# Patient Record
Sex: Female | Born: 1965 | Race: Black or African American | Hispanic: No | Marital: Married | Smoking: Never smoker
Health system: Southern US, Community
[De-identification: ages and names within clinical notes are randomized; demographics above are authoritative.]

## PROBLEM LIST (undated history)

## (undated) DIAGNOSIS — T7840XA Allergy, unspecified, initial encounter: Secondary | ICD-10-CM

## (undated) DIAGNOSIS — E119 Type 2 diabetes mellitus without complications: Secondary | ICD-10-CM

## (undated) DIAGNOSIS — D573 Sickle-cell trait: Secondary | ICD-10-CM

## (undated) DIAGNOSIS — R42 Dizziness and giddiness: Secondary | ICD-10-CM

## (undated) HISTORY — DX: Dizziness and giddiness: R42

## (undated) HISTORY — DX: Allergy, unspecified, initial encounter: T78.40XA

## (undated) HISTORY — DX: Sickle-cell trait: D57.3

---

## 1998-10-21 ENCOUNTER — Other Ambulatory Visit: Admission: RE | Admit: 1998-10-21 | Discharge: 1998-10-21 | Payer: Self-pay | Admitting: Family Medicine

## 2006-06-10 ENCOUNTER — Ambulatory Visit: Payer: Self-pay | Admitting: Family Medicine

## 2006-07-19 ENCOUNTER — Ambulatory Visit: Payer: Self-pay | Admitting: Family Medicine

## 2006-07-19 ENCOUNTER — Encounter (INDEPENDENT_AMBULATORY_CARE_PROVIDER_SITE_OTHER): Payer: Self-pay | Admitting: Family Medicine

## 2007-07-13 ENCOUNTER — Emergency Department (HOSPITAL_COMMUNITY): Admission: EM | Admit: 2007-07-13 | Discharge: 2007-07-13 | Payer: Self-pay | Admitting: Emergency Medicine

## 2008-05-03 ENCOUNTER — Emergency Department (HOSPITAL_COMMUNITY): Admission: EM | Admit: 2008-05-03 | Discharge: 2008-05-03 | Payer: Self-pay | Admitting: Emergency Medicine

## 2010-09-16 ENCOUNTER — Other Ambulatory Visit (HOSPITAL_COMMUNITY)
Admission: RE | Admit: 2010-09-16 | Discharge: 2010-09-16 | Disposition: A | Discharge status: Home / Self Care | Payer: BC Managed Care – PPO | Source: Ambulatory Visit | Attending: Obstetrics and Gynecology | Admitting: Obstetrics and Gynecology

## 2010-09-16 ENCOUNTER — Other Ambulatory Visit: Payer: Self-pay | Admitting: Obstetrics and Gynecology

## 2010-09-16 DIAGNOSIS — Z01419 Encounter for gynecological examination (general) (routine) without abnormal findings: Secondary | ICD-10-CM | POA: Insufficient documentation

## 2010-09-16 DIAGNOSIS — Z113 Encounter for screening for infections with a predominantly sexual mode of transmission: Secondary | ICD-10-CM | POA: Insufficient documentation

## 2010-09-16 DIAGNOSIS — Z1159 Encounter for screening for other viral diseases: Secondary | ICD-10-CM | POA: Insufficient documentation

## 2011-03-12 LAB — POCT RAPID STREP A: Streptococcus, Group A Screen (Direct): NEGATIVE

## 2011-10-26 ENCOUNTER — Other Ambulatory Visit (HOSPITAL_COMMUNITY)
Admission: RE | Admit: 2011-10-26 | Discharge: 2011-10-26 | Disposition: A | Discharge status: Home / Self Care | Payer: BC Managed Care – PPO | Source: Ambulatory Visit | Attending: Obstetrics and Gynecology | Admitting: Obstetrics and Gynecology

## 2011-10-26 ENCOUNTER — Other Ambulatory Visit: Payer: Self-pay | Admitting: Obstetrics and Gynecology

## 2011-10-26 DIAGNOSIS — Z01419 Encounter for gynecological examination (general) (routine) without abnormal findings: Secondary | ICD-10-CM | POA: Insufficient documentation

## 2013-06-04 ENCOUNTER — Encounter (HOSPITAL_COMMUNITY): Payer: Self-pay | Admitting: Emergency Medicine

## 2013-06-04 ENCOUNTER — Emergency Department (INDEPENDENT_AMBULATORY_CARE_PROVIDER_SITE_OTHER)
Admission: EM | Admit: 2013-06-04 | Discharge: 2013-06-04 | Disposition: A | Discharge status: Home / Self Care | Payer: BC Managed Care – PPO | Source: Home / Self Care | Attending: Family Medicine | Admitting: Family Medicine

## 2013-06-04 DIAGNOSIS — H8110 Benign paroxysmal vertigo, unspecified ear: Secondary | ICD-10-CM

## 2013-06-04 MED ORDER — MECLIZINE HCL 25 MG PO TABS: 25.0000 mg | ORAL_TABLET | Freq: Three times a day (TID) | ORAL | Status: DC | PRN

## 2013-06-04 NOTE — ED Notes (Signed)
Reportedly dizzy since last PM when she tried to read something; worse when she changes direction

## 2013-06-04 NOTE — ED Notes (Signed)
Encouraged no driving while dizzy or on her Rx ; advised to f/u w ENT if no better

## 2013-06-04 NOTE — ED Provider Notes (Signed)
Kelly Shelton is a 47 y.o. female who presents to Urgent Care today for dizziness. This is been present since yesterday. Patient notes an unsteady still spitting room sensation especially with head motion. She denies any weakness numbness loss of function trouble swallowing or speaking. She had a headache yesterday but none today. She denies any significant blurry vision. No nausea vomiting or diarrhea. No injury.   History reviewed. No pertinent past medical history. History  Substance Use Topics  . Smoking status: Never Smoker   . Smokeless tobacco: Not on file  . Alcohol Use: No   ROS as above Medications reviewed. No current facility-administered medications for this encounter.   Current Outpatient Prescriptions  Medication Sig Dispense Refill  . meclizine (ANTIVERT) 25 MG tablet Take 1 tablet (25 mg total) by mouth 3 (three) times daily as needed for dizziness.  28 tablet  0    Exam:  BP 149/97  Pulse 65  Temp(Src) 97.5 F (36.4 C) (Oral)  Resp 18  SpO2 98% Gen: Well NAD HEENT: EOMI,  MMM, tympanic membranes are normal appearing bilaterally Lungs: Normal work of breathing. CTABL Heart: RRR no MRG Exts: Non edematous BL  LE, warm and well perfused.  Neuro: Alert and oriented cranial nerves II through XII are intact normal reflexes strength and sensation. Normal coordination gait. 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  Plan to treat with meclizine. If not improved followup with ENT.  Discussed warning signs or symptoms. Please see discharge instructions. Patient expresses understanding.      Rodolph Bong, MD 06/04/13 [phone]

## 2013-10-24 ENCOUNTER — Encounter: Payer: Self-pay | Admitting: Obstetrics & Gynecology

## 2013-10-24 ENCOUNTER — Ambulatory Visit (INDEPENDENT_AMBULATORY_CARE_PROVIDER_SITE_OTHER): Payer: BC Managed Care – PPO | Admitting: Obstetrics & Gynecology

## 2013-10-24 VITALS — Temp 97.7°F | Ht 59.0 in | Wt 270.0 lb

## 2013-10-24 DIAGNOSIS — N951 Menopausal and female climacteric states: Secondary | ICD-10-CM

## 2013-10-24 DIAGNOSIS — E2839 Other primary ovarian failure: Secondary | ICD-10-CM

## 2013-10-24 DIAGNOSIS — Z780 Asymptomatic menopausal state: Secondary | ICD-10-CM

## 2013-10-24 DIAGNOSIS — E288 Other ovarian dysfunction: Secondary | ICD-10-CM

## 2013-10-24 NOTE — Progress Notes (Signed)
See other note from today

## 2013-10-25 DIAGNOSIS — E2839 Other primary ovarian failure: Secondary | ICD-10-CM | POA: Insufficient documentation

## 2013-10-25 DIAGNOSIS — E288 Other ovarian dysfunction: Principal | ICD-10-CM

## 2013-10-25 LAB — GC/CHLAMYDIA PROBE AMP
CT Probe RNA: NEGATIVE
GC Probe RNA: NEGATIVE

## 2013-10-25 LAB — FOLLICLE STIMULATING HORMONE: FSH: 34.4 m[IU]/mL

## 2013-10-25 LAB — ESTRADIOL: Estradiol: 16.5 pg/mL

## 2013-10-25 NOTE — Progress Notes (Signed)
Patient ID: Kelly Shelton, female   DOB: 05/17/1966, 48 y.o.   MRN: [id number]  Chief Complaint  Patient presents with  . Advice Only    Infertility    HPI Kelly Shelton is a 48 y.o. female.  Diagnosed by a local gynecologist with early menopause several years ago.  A baseline BMD study was normal per the pt.  HRT was recommended.  The patient was noncompliant with HRT.  She recently married and wants to explore fertility options. HPI  Past Medical History  Diagnosis Date  . Vertigo     Past Surgical History  Procedure Laterality Date  . Cesarean section      Family History  Problem Relation Age of Onset  . Adopted: Yes    Social History History  Substance Use Topics  . Smoking status: Never Smoker   . Smokeless tobacco: Not on file  . Alcohol Use: No    No Known Allergies  No current outpatient prescriptions on file.   No current facility-administered medications for this visit.    Review of Systems Review of Systems Constitutional: negative for fatigue and weight loss Respiratory: negative for cough and wheezing Cardiovascular: negative for chest pain, fatigue and palpitations Gastrointestinal: negative for abdominal pain and change in bowel habits Genitourinary:negative Integument/breast: negative for nipple discharge Musculoskeletal:negative for myalgias Neurological: negative for gait problems and tremors Behavioral/Psych: negative for abusive relationship, depression Endocrine: negative for temperature intolerance     Temperature 97.7 F (36.5 C), height 4\' 11"  (1.499 m), weight 122.471 kg (270 lb).  Physical Exam Physical Exam General:   morbidly obese  Skin:   no rash or abnormalities  Lungs:   clear to auscultation bilaterally  Heart:   regular rate and rhythm, S1, S2 normal, no murmur, click, rub or gallop  Breasts:   normal without suspicious masses, skin or nipple changes or axillary nodes  Abdomen:  normal findings: no organomegaly, soft,  non-tender and no hernia  Pelvis:  External genitalia: normal general appearance Urinary system: urethral meatus normal and bladder without fullness, nontender Vaginal: normal without tenderness, induration or masses Cervix: normal appearance Adnexa: limited by body habitus Uterus: limited by body habitus   Data Reviewed None  Assessment    Likely early menopause/ovarian failure;  DDX--> 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         Antionette CharLisa Jackson-Moore 10/25/2013, 4:05 AM

## 2013-10-25 NOTE — Patient Instructions (Signed)
Menopause Menopause is the normal time of life when menstrual periods stop completely. Menopause is complete when you have missed 12 consecutive menstrual periods. It usually occurs between the ages of 46 years and 71 years. Very rarely does a woman develop menopause before the age of 46 years. At menopause, your ovaries stop producing the female hormones estrogen and progesterone. This can cause undesirable symptoms and also affect your health. Sometimes the symptoms may occur 4 5 years before the menopause begins. There is no relationship between menopause and:  Oral contraceptives.  Number of children you had.  Race.  The age your menstrual periods started (menarche). Heavy smokers and very thin women may develop menopause earlier in life. CAUSES  The ovaries stop producing the female hormones estrogen and progesterone.  Other causes include:  Surgery to remove both ovaries.  The ovaries stop functioning for no known reason.  Tumors of the pituitary gland in the brain.  Medical disease that affects the ovaries and hormone production.  Radiation treatment to the abdomen or pelvis.  Chemotherapy that affects the ovaries. SYMPTOMS   Hot flashes.  Night sweats.  Decrease in sex drive.  Vaginal dryness and thinning of the vagina causing painful intercourse.  Dryness of the skin and developing wrinkles.  Headaches.  Tiredness.  Irritability.  Memory problems.  Weight gain.  Bladder infections.  Hair growth of the face and chest.  Infertility. More serious symptoms include:  Loss of bone (osteoporosis) causing breaks (fractures).  Depression.  Hardening and narrowing of the arteries (atherosclerosis) causing heart attacks and strokes. DIAGNOSIS   When the menstrual periods have stopped for 12 straight months.  Physical exam.  Hormone studies of the blood. TREATMENT  There are many treatment choices and nearly as many questions about them. The  decisions to treat or not to treat menopausal changes is an individual choice made with your health care provider. Your health care provider can discuss the treatments with you. Together, you can decide which treatment will work best for you. Your treatment choices may include:   Hormone therapy (estrogen and progesterone).  Non-hormonal medicines.  Treating the individual symptoms with medicine (for example antidepressants for depression).  Herbal medicines that may help specific symptoms.  Counseling by a psychiatrist or psychologist.  Group therapy.  Lifestyle changes including:  Eating healthy.  Regular exercise.  Limiting caffeine and alcohol.  Stress management and meditation.  No treatment. HOME CARE INSTRUCTIONS   Take the medicine your health care provider gives you as directed.  Get plenty of sleep and rest.  Exercise regularly.  Eat a diet that contains calcium (good for the bones) and soy products (acts like estrogen hormone).  Avoid alcoholic beverages.  Do not smoke.  If you have hot flashes, dress in layers.  Take supplements, calcium, and vitamin D to strengthen bones.  You can use over-the-counter lubricants or moisturizers for vaginal dryness.  Group therapy is sometimes very helpful.  Acupuncture may be helpful in some cases. SEEK MEDICAL CARE IF:   You are not sure you are in menopause.  You are having menopausal symptoms and need advice and treatment.  You are still having menstrual periods after age 46 years.  You have pain with intercourse.  Menopause is complete (no menstrual period for 12 months) and you develop vaginal bleeding.  You need a referral to a specialist (gynecologist, psychiatrist, or psychologist) for treatment. SEEK IMMEDIATE MEDICAL CARE IF:   You have severe depression.  You have excessive vaginal bleeding.  You fell and think you have a broken bone.  You have pain when you urinate.  You develop leg or  chest pain.  You have a fast pounding heart beat (palpitations).  You have severe headaches.  You develop vision problems.  You feel a lump in your breast.  You have abdominal pain or severe indigestion. Document Released: 08/28/2003 Document Revised: 02/07/2013 Document Reviewed: 01/04/2013 [facility] Patient Information 2014 [place], [place]. Infertility WHAT IS INFERTILITY?  Infertility is usually defined as not being able to get pregnant after trying for one year of regular sexual intercourse without the use of contraceptives. Or not being able to carry a pregnancy to term and have a baby. The infertility rate in the [place] is around 10%. Pregnancy is the result of a chain of events. A woman must release an egg from one of her ovaries (ovulation). The egg must be fertilized by the female sperm. Then it travels through a fallopian tube into the uterus (womb), where it attaches to the wall of the uterus and grows. A man must have enough sperm, and the sperm must join with (fertilize) the egg along the way, at the proper time. The fertilized egg must then become attached to the inside of the uterus. While this may seem simple, many things can happen to prevent pregnancy from occurring.  WHOSE PROBLEM IS IT?  About 20% of infertility cases are due to problems with the man (female factors) and 65% are due to problems with the woman (female factors). Other cases are due to a combination of female and female factors or to unknown causes.  WHAT CAUSES INFERTILITY IN MEN?  Infertility in men is often caused by problems with making enough normal sperm or getting the sperm to reach the egg. Problems with sperm may exist from birth or develop later in life, due to illness or injury. Some men produce no sperm, or produce too few sperm (oligospermia). Other problems include:  Sexual dysfunction.  Hormonal or endocrine problems.  Age. Female fertility decreases with age, but not at as young an age as  female fertility.  Infection.  Congenital problems. Birth defect, such as absence of the tubes that carry the sperm (vas deferens).  Genetic/chromosomal problems.  Antisperm antibody problems.  Retrograde ejaculation (sperm go into the bladder).  Varicoceles, spematoceles, or tumors of the testicles.  Lifestyle can influence the number and quality of a man's sperm.  Alcohol and drugs can temporarily reduce sperm quality.  Environmental toxins, including pesticides and lead, may cause some cases of infertility in men. WHAT CAUSES INFERTILITY IN WOMEN?   Problems with ovulation account for most infertility in women. Without ovulation, eggs are not available to be fertilized.  Signs of problems with ovulation include irregular menstrual periods or no periods at all.  Simple lifestyle factors, including stress, diet, or athletic training, can affect a woman's hormonal balance.  Age. Fertility begins to decrease in women in the early 60s and is worse after age 31.  Much less often, a hormonal imbalance from a serious medical problem, such as a pituitary gland tumor, thyroid or other chronic medical disease, can cause ovulation problems.  Pelvic infections.  Polycystic ovary syndrome (increase in female hormones, unable to ovulate).  Alcohol or illegal drugs.  Environmental toxins, radiation, pesticides, and certain chemicals.  Aging is an important factor in female infertility.  The ability of a woman's ovaries to produce eggs declines with age, especially after age 57. About one third of couples where  the woman is over 79 will have problems with fertility.  By the time she reaches menopause when her monthly periods stop for good, a woman can no longer produce eggs or become pregnant.  Other problems can also lead to infertility in women. If the fallopian tubes are blocked at one or both ends, the egg cannot travel through the tubes into the uterus. Scar tissue (adhesions) in  the pelvis may cause blocked tubes. This may result from pelvic inflammatory disease, endometriosis, or surgery for an ectopic pregnancy (fertilized egg implanted outside the uterus) or any pelvic or abdominal surgery causing adhesions.  Fibroid tumors or polyps of the uterus.  Congenital (birth defect) abnormalities of the uterus.  Infection of the cervix (cervicitis).  Cervical stenosis (narrowing).  Abnormal cervical mucus.  Polycystic ovary syndrome.  Having sexual intercourse too often (every other day or 4 to 5 times a week).  Obesity.  Anorexia.  Poor nutrition.  Over exercising, with loss of body fat.  DES. Your mother received diethylstilbesterol hormone when pregnant with you. HOW IS INFERTILITY TESTED?  If you have been trying to have a baby without success, you may want to seek medical help. You should not wait for one year of trying before seeing a health care provider if:  You are over 35.  You have reason to believe that there may be a fertility problem. A medical evaluation may determine the reasons for a couple's infertility. Usually this process begins with:  Physical exams.  Medical histories of both partners.  Sexual histories of both partners. If there is no obvious problem, like improperly timed intercourse or absence of ovulation, tests may be needed.   For a man, testing usually begins with tests of his semen to look at:  The number of sperm.  The shape of sperm.  Movement of his sperm.  Taking a complete medical and surgical history.  Physical examination.  Check for infection of the female reproductive organs. Sometimes hormone tests are done.   For a woman, the first step in testing is to find out if she is ovulating each month. There are several ways to do this. For example, she can keep track of changes in her morning body temperature and in the texture of her cervical mucus. Another tool is a home ovulation test kit, which can be  bought at drug or grocery stores.  Checks of ovulation can also be done in the doctor's office, using blood tests for hormone levels or ultrasound tests of the ovaries. If the woman is ovulating, more tests will need to be done. Some common female tests include:  Hysterosalpingogram: An x-ray of the fallopian tubes and uterus after they are injected with dye. It shows if the tubes are open and shows the shape of the uterus.  Laparoscopy: An exam of the tubes and other female organs for disease. A lighted tube called a laparoscope is used to see inside the abdomen.  Endometrial biopsy: Sample of uterus tissue taken on the first day of the menstrual period, to see if the tissue indicates you are ovulating.  Transvaginal ultrasound: Examines the female organs.  Hysteroscopy: Uses a lighted tube to examine the cervix and inside the uterus, to see if there are any abnormalities inside the uterus. TREATMENT  Depending on the test results, different treatments can be suggested. The type of treatment depends on the cause. 85 to 90% of infertility cases are treated with drugs or surgery.   Various fertility drugs may be  used for women with ovulation problems. It is important to talk with your caregiver about the drug to be used. You should understand the drug's benefits and side effects. Depending on the type of fertility drug and the dosage of the drug used, multiple births (twins or multiples) can occur in some women.  If needed, surgery can be done to repair damage to a woman's ovaries, fallopian tubes, cervix, or uterus.  Surgery or medical treatment for endometriosis or polycystic ovary syndrome. Sometimes a man has an infertility problem that can be corrected with medicine or by surgery.  Intrauterine insemination (IUI) of sperm, timed with ovulation.  Change in lifestyle, if that is the cause (lose weight, increase exercise, and stop smoking, drinking excessively, or taking illegal  drugs).  Other types of surgery:  Removing growths inside and on the uterus.  Removing scar tissue from inside of the uterus.  Fixing blocked tubes.  Removing scar tissue in the pelvis and around the female organs. WHAT IS ASSISTED REPRODUCTIVE TECHNOLOGY (ART)?  Assisted reproductive technology (ART) is another form of special methods used to help infertile couples. ART involves handling both the woman's eggs and the man's sperm. Success rates vary and depend on many factors. ART can be expensive and time-consuming. But ART has made it possible for many couples to have children that otherwise would not have been conceived. Some methods are listed below:  In vitro fertilization (IVF). IVF is often used when a woman's fallopian tubes are blocked or when a man has low sperm counts. A drug is used to stimulate the ovaries to produce multiple eggs. Once mature, the eggs are removed and placed in a culture dish with the man's sperm for fertilization. After about 40 hours, the eggs are examined to see if they have become fertilized by the sperm and are dividing into cells. These fertilized eggs (embryos) are then placed in the woman's uterus. This bypasses the fallopian tubes.  Gamete intrafallopian transfer (GIFT) is similar to IVF, but used when the woman has at least one normal fallopian tube. Three to five eggs are placed in the fallopian tube, along with the man's sperm, for fertilization inside the woman's body.  Zygote intrafallopian transfer (ZIFT), also called tubal embryo transfer, combines IVF and GIFT. The eggs retrieved from the woman's ovaries are fertilized in the lab and placed in the fallopian tubes rather than in the uterus.  ART procedures sometimes involve the use of donor eggs (eggs from another woman) or previously frozen embryos. Donor eggs may be used if a woman has impaired ovaries or has a genetic disease that could be passed on to her baby.  When performing ART, you are at  higher risk for resulting in multiple pregnancies, twins, triplets or more.  Intracytoplasma sperm injection is a procedure that injects a single sperm into the egg to fertilize it.  Embryo transplant is a procedure that starts after growing an embryo in a special media (chemical solution) developed to keep the embryo alive for 2 to 5 days, and then transplanting it into the uterus. In cases where a cause cannot be found and pregnancy does not occur, adoption may be a consideration. Document Released: 06/10/2003 Document Revised: 08/30/2011 Document Reviewed: 05/06/2009 [facility] Patient Information 2014 [place].

## 2014-04-22 ENCOUNTER — Encounter: Payer: Self-pay | Admitting: Obstetrics & Gynecology

## 2014-06-17 ENCOUNTER — Encounter: Payer: Self-pay | Admitting: *Deleted

## 2014-06-18 ENCOUNTER — Encounter: Payer: Self-pay | Admitting: Obstetrics & Gynecology

## 2015-10-12 ENCOUNTER — Encounter (HOSPITAL_COMMUNITY): Payer: Self-pay

## 2015-10-12 ENCOUNTER — Emergency Department (HOSPITAL_COMMUNITY): Payer: BLUE CROSS/BLUE SHIELD

## 2015-10-12 ENCOUNTER — Emergency Department (HOSPITAL_COMMUNITY)
Admission: EM | Admit: 2015-10-12 | Discharge: 2015-10-12 | Disposition: A | Discharge status: Home / Self Care | Payer: BLUE CROSS/BLUE SHIELD | Attending: Emergency Medicine | Admitting: Emergency Medicine

## 2015-10-12 DIAGNOSIS — E1165 Type 2 diabetes mellitus with hyperglycemia: Secondary | ICD-10-CM | POA: Insufficient documentation

## 2015-10-12 DIAGNOSIS — N390 Urinary tract infection, site not specified: Secondary | ICD-10-CM | POA: Diagnosis not present

## 2015-10-12 DIAGNOSIS — D739 Disease of spleen, unspecified: Secondary | ICD-10-CM

## 2015-10-12 DIAGNOSIS — D7389 Other diseases of spleen: Secondary | ICD-10-CM | POA: Insufficient documentation

## 2015-10-12 DIAGNOSIS — Z7984 Long term (current) use of oral hypoglycemic drugs: Secondary | ICD-10-CM | POA: Insufficient documentation

## 2015-10-12 DIAGNOSIS — R112 Nausea with vomiting, unspecified: Secondary | ICD-10-CM | POA: Diagnosis present

## 2015-10-12 DIAGNOSIS — Z3202 Encounter for pregnancy test, result negative: Secondary | ICD-10-CM | POA: Insufficient documentation

## 2015-10-12 DIAGNOSIS — R739 Hyperglycemia, unspecified: Secondary | ICD-10-CM

## 2015-10-12 DIAGNOSIS — R110 Nausea: Secondary | ICD-10-CM

## 2015-10-12 HISTORY — DX: Type 2 diabetes mellitus without complications: E11.9

## 2015-10-12 LAB — URINALYSIS, ROUTINE W REFLEX MICROSCOPIC
Bilirubin Urine: NEGATIVE
Glucose, UA: >1000 mg/dL — AB
Hgb urine dipstick: NEGATIVE
Ketones, ur: 40 mg/dL — AB
Leukocytes, UA: NEGATIVE
Nitrite: POSITIVE — AB
Protein, ur: NEGATIVE mg/dL
Specific Gravity, Urine: 1.036 — ABNORMAL HIGH (ref 1.005–1.030)
pH: 5 (ref 5.0–8.0)

## 2015-10-12 LAB — CBC WITH DIFFERENTIAL/PLATELET
Basophils Absolute: 0 10*3/uL (ref 0.0–0.1)
Basophils Relative: 0 %
Eosinophils Absolute: 0.1 10*3/uL (ref 0.0–0.7)
Eosinophils Relative: 1 %
HCT: 44.8 % (ref 36.0–46.0)
Hemoglobin: 14.3 g/dL (ref 12.0–15.0)
Lymphocytes Relative: 15 %
Lymphs Abs: 1.3 10*3/uL (ref 0.7–4.0)
MCH: 23.8 pg — ABNORMAL LOW (ref 26.0–34.0)
MCHC: 31.9 g/dL (ref 30.0–36.0)
MCV: 74.5 fL — ABNORMAL LOW (ref 78.0–100.0)
Monocytes Absolute: 0.4 10*3/uL (ref 0.1–1.0)
Monocytes Relative: 5 %
Neutro Abs: 6.9 10*3/uL (ref 1.7–7.7)
Neutrophils Relative %: 79 %
Platelets: 320 10*3/uL (ref 150–400)
RBC: 6.01 MIL/uL — ABNORMAL HIGH (ref 3.87–5.11)
RDW: 15.4 % (ref 11.5–15.5)
WBC: 8.7 10*3/uL (ref 4.0–10.5)

## 2015-10-12 LAB — COMPREHENSIVE METABOLIC PANEL
ALT: 30 U/L (ref 14–54)
AST: 26 U/L (ref 15–41)
Albumin: 3.5 g/dL (ref 3.5–5.0)
Alkaline Phosphatase: 104 U/L (ref 38–126)
Anion gap: 15 (ref 5–15)
BUN: 9 mg/dL (ref 6–20)
CO2: 25 mmol/L (ref 22–32)
Calcium: 10 mg/dL (ref 8.9–10.3)
Chloride: 104 mmol/L (ref 101–111)
Creatinine, Ser: 1.09 mg/dL — ABNORMAL HIGH (ref 0.44–1.00)
GFR calc Af Amer: >60 mL/min (ref >60)
GFR calc non Af Amer: 59 mL/min — ABNORMAL LOW (ref >60)
Glucose, Bld: 575 mg/dL (ref 65–99)
Potassium: 4.7 mmol/L (ref 3.5–5.1)
Sodium: 144 mmol/L (ref 135–145)
Total Bilirubin: 0.6 mg/dL (ref 0.3–1.2)
Total Protein: 7.8 g/dL (ref 6.5–8.1)

## 2015-10-12 LAB — URINE MICROSCOPIC-ADD ON: RBC / HPF: NONE SEEN RBC/hpf (ref 0–5)

## 2015-10-12 LAB — POC URINE PREG, ED: Preg Test, Ur: NEGATIVE

## 2015-10-12 LAB — CBG MONITORING, ED: Glucose-Capillary: 420 mg/dL — ABNORMAL HIGH (ref 65–99)

## 2015-10-12 LAB — LIPASE, BLOOD: Lipase: 29 U/L (ref 11–51)

## 2015-10-12 MED ORDER — FLUCONAZOLE 100 MG PO TABS
200.0000 mg | ORAL_TABLET | Freq: Once | ORAL | Status: AC
  Administered 2015-10-12: 200 mg via ORAL
  Filled 2015-10-12: qty 2

## 2015-10-12 MED ORDER — SODIUM CHLORIDE 0.9 % IV BOLUS (SEPSIS)
2000.0000 mL | Freq: Once | INTRAVENOUS | Status: AC
  Administered 2015-10-12: 2000 mL via INTRAVENOUS

## 2015-10-12 MED ORDER — ONDANSETRON HCL 4 MG/2ML IJ SOLN
4.0000 mg | Freq: Once | INTRAMUSCULAR | Status: AC
  Administered 2015-10-12: 4 mg via INTRAVENOUS
  Filled 2015-10-12: qty 2

## 2015-10-12 MED ORDER — NITROFURANTOIN MONOHYD MACRO 100 MG PO CAPS: 100.0000 mg | ORAL_CAPSULE | Freq: Two times a day (BID) | ORAL | Status: DC

## 2015-10-12 NOTE — ED Notes (Signed)
Pt from home with complaints of right ear pain. Pt states she has fluid behind her ear and has been seen by an EENT. Pt also complains of nausea and vomiting. Pt states "stomach feels full."

## 2015-10-12 NOTE — ED Provider Notes (Signed)
CSN: [id number]     Arrival date & time 10/12/15  [id number] History   First MD Initiated Contact with Patient 10/12/15 0720     Chief Complaint  Patient presents with  . Nausea  . Emesis  . Otalgia    right     (Consider location/radiation/quality/duration/timing/severity/associated sxs/prior Treatment) HPI  50 year old female presents with a chief complaint of nausea, vomiting, and upper abdominal pain. Started about 2 weeks ago. Saw her PCP 10 days ago and was diagnosed with diabetes and started on metformin. She vomited once at the beginning but otherwise has had persistent nausea and decreased appetite. Her stomach pain is mostly epigastric and right upper quadrant and feels like "an emptiness". Pain feels temporarily better for about 30 minutes after eating and then comes back. No chest pain or back pain. Since starting metformin she has developed one loose stool per day but no increased volume. No urinary symptoms. Vomited once this AM.  Also complains of persistent right ear pain and muffled noise. Patient has had this for 3 weeks. Started after an initial URI. Some pain, more a feeling of fluid and decreased hearing. Saw ENT 1 week ago and was told it was allergic. Is on flonase and claritin but no relief. No headache. No dizziness.   Past Medical History  Diagnosis Date  . Vertigo   . Diabetes mellitus without complication [facility](HCC)    Past Surgical History  Procedure Laterality Date  . Cesarean section     Family History  Problem Relation Age of Onset  . Adopted: Yes   Social History  Substance Use Topics  . Smoking status: Never Smoker   . Smokeless tobacco: None  . Alcohol Use: No   OB History    Gravida Para Term Preterm AB TAB SAB Ectopic Multiple Living   1 1 1             Review of Systems  HENT: Positive for ear pain. Negative for tinnitus.   Cardiovascular: Negative for chest pain.  Gastrointestinal: Positive for nausea, vomiting, abdominal pain and diarrhea.  Negative for constipation.  Genitourinary: Negative for dysuria.  Neurological: Negative for dizziness, weakness, light-headedness and numbness.  All other systems reviewed and are negative.     Allergies  Review of patient's allergies indicates no known allergies.  Home Medications   Prior to Admission medications   Medication Sig Start Date End Date Taking? Authorizing Provider  fluticasone (FLONASE) 50 MCG/ACT nasal spray Place 2 sprays into both nostrils as needed for allergies or rhinitis.   Yes Historical Provider, MD  loratadine (CLARITIN) 10 MG tablet Take 10 mg by mouth daily as needed for allergies.   Yes Historical Provider, MD  metFORMIN (GLUCOPHAGE) 500 MG tablet Take 1,000 mg by mouth at bedtime. 10/01/15  Yes Historical Provider, MD   BP 137/87 mmHg  Pulse 120  Temp(Src) 98.4 F (36.9 C) (Oral)  Resp 18  SpO2 100% Physical Exam  Constitutional: She is oriented to person, place, and time. She appears well-developed and well-nourished.  obese  HENT:  Head: Normocephalic and atraumatic.  Right Ear: External ear normal. No mastoid tenderness.  Left Ear: Tympanic membrane and external ear normal. No mastoid tenderness.  Nose: Nose normal.  Possibly mild fluid behind right TM, no erythema  Eyes: Right eye exhibits no discharge. Left eye exhibits no discharge.  Cardiovascular: Normal rate, regular rhythm and normal heart sounds.   Pulmonary/Chest: Effort normal and breath sounds normal.  Abdominal: Soft. There is tenderness (mild)  in the right upper quadrant.  Neurological: She is alert and oriented to person, place, and time.  Skin: Skin is warm and dry.  Nursing note and vitals reviewed.   ED Course  Procedures (including critical care time) Labs Review Labs Reviewed  COMPREHENSIVE METABOLIC PANEL - Abnormal; Notable for the following:    Glucose, Bld 575 (*)    Creatinine, Ser 1.09 (*)    GFR calc non Af Amer 59 (*)    All other components within normal  limits  CBC WITH DIFFERENTIAL/PLATELET - Abnormal; Notable for the following:    RBC 6.01 (*)    MCV 74.5 (*)    MCH 23.8 (*)    All other components within normal limits  URINALYSIS, ROUTINE W REFLEX MICROSCOPIC (NOT AT [facility]) - Abnormal; Notable for the following:    APPearance CLOUDY (*)    Specific Gravity, Urine 1.036 (*)    Glucose, UA >1000 (*)    Ketones, ur 40 (*)    Nitrite POSITIVE (*)    All other components within normal limits  URINE MICROSCOPIC-ADD ON - Abnormal; Notable for the following:    Squamous Epithelial / LPF 0-5 (*)    Bacteria, UA FEW (*)    All other components within normal limits  CBG MONITORING, ED - Abnormal; Notable for the following:    Glucose-Capillary 420 (*)    All other components within normal limits  URINE CULTURE  LIPASE, BLOOD  POC URINE PREG, ED    Imaging Review US Abdomen Complete  10/12/2015  CLINICAL DATA:  Nausea, vomiting, weakness for 1 week. History of diabetes. EXAM: ABDOMEN ULTRASOUND COMPLETE COMPARISON:  None. FINDINGS: Gallbladder: Gallbladder has a normal appearance. Gallbladder wall is 2.0 mm, within normal limits. No stones or pericholecystic fluid. No sonographic Murphy's sign. Common bile duct: Diameter: 2.6 mm Liver: The liver is echogenic. There is attenuation of the ultrasound wave, poor visualization of the internal hepatic architecture, and loss of definition of the diaphragm. No focal liver lesions are identified. IVC: No abnormality visualized. Pancreas: Visualized portion unremarkable. Spleen: Spleen is 5.5 cm in length. There is a mildly heterogeneous circumscribed hyperechoic lesion within the spleen measuring 1.0 x 1.1 x 0.9 cm. Right Kidney: Length: 9.7 cm. Echogenicity within normal limits. No mass or hydronephrosis visualized. Left Kidney: Length: 10.9 cm. Echogenicity within normal limits. No mass or hydronephrosis visualized. Abdominal aorta: No aneurysm visualized. Other findings: None. IMPRESSION: 1. No evidence  for acute cholecystitis. 2. Hyperechoic lesion within the spleen. Given the slightly heterogeneous appearance, further characterization with MRI is recommended. MRI should be performed when the patient is clinically stable and able to follow breath holding instructions (usually best performed on an outpatient basis). 3. Hepatic steatosis. Electronically Signed   By: Norva Pavlov M.D.   On: 10/12/2015 09:00   I have personally reviewed and evaluated these images and lab results as part of my medical decision-making.   EKG Interpretation   Date/Time:  Sunday October 12 2015 07:51:18 EDT Ventricular Rate:  107 PR Interval:  139 QRS Duration: 80 QT Interval:  336 QTC Calculation: 448 R Axis:   80 Text Interpretation:  Sinus tachycardia Low voltage, precordial leads No  old tracing to compare Confirmed by Pratik Dalziel  MD, Xoey Warmoth ([id number]) on  10/12/2015 7:53:33 AM Also confirmed by Criss Alvine  MD, Altheia Shafran ([id number]), editor  [place], Cala Bradford [phone])  on 10/12/2015 10:26:13 AM      MDM   Final diagnoses:  Hyperglycemia  Nausea in adult  Lesion  of spleen  UTI (lower urinary tract infection)    Patient's workup shows hyperglycemia without evidence of DKA. Heart rate and patient's overall symptoms of the better after IV fluids. No acidosis on BMP. Ultrasound shows no obvious acute cause for upper abdominal pain. Nonspecific lesion of her spleen, made patient aware that she needs MRI for follow-up. She denies wanting nausea medicine upon discharge. Her ear symptoms are of unclear etiology, likely fluid related, will refer back to ENT. Her urine does show concerning findings for possible UTI with nitrites and some WBC on microscope. Given nonspecific symptoms will treat as possible UTI. Also given Diflucan for yeast seen on urine. Recommend taking her metformin as prescribed, sticking to diet changes for diabetes, and following up with her PCP.    Pricilla Loveless, MD 10/12/15 1236

## 2015-10-14 LAB — URINE CULTURE: Culture: >=100000 — AB

## 2015-10-15 ENCOUNTER — Telehealth: Payer: Self-pay | Admitting: *Deleted

## 2015-10-15 NOTE — ED Notes (Unsigned)
Post ED Visit - Positive Culture Follow-up: Successful Patient Follow-Up  Culture assessed and recommendations reviewed by: []  Enzo BiNathan Batchelder, Pharm.D. []  Celedonio MiyamotoJeremy Frens, Pharm.D., BCPS []  Garvin FilaMike Maccia, Pharm.D. []  Georgina PillionElizabeth Martin, Pharm.D., BCPS []  ClarktonMinh Pham, [address] BoulevardPharm.D., BCPS, AAHIVP []  Estella HuskMichelle Turner, Pharm.D., BCPS, AAHIVP []  Tennis Mustassie Stewart, Pharm.D. []  Sherle Poeob Vincent, [address] BoulevardPharm.D.  Positive *** culture  []  Patient discharged without antimicrobial prescription and treatment is now indicated [x]  Organism is resistant to prescribed ED discharge antimicrobial []  Patient with positive blood cultures  Changes discussed with ED provider, Glean HessElizabeth Westfall, PA-C Stop Nitrofurantoin per E. Westfall, PA-C instruction.   Contacted patient, date 10/15/2015, time 1045: left message to stop Nitrofurantoin and call Patient Placement RN if questions.   Virl AxeRobertson, Jarica Plass [facility] 10/15/2015, 11:31 AM

## 2015-10-15 NOTE — Progress Notes (Signed)
ED Antimicrobial Stewardship Positive Culture Follow Up   Lucretia KernLisa Needham is an 50 y.o. female [facility] with a chief complaint of  Chief Complaint  Patient presents with  . Nausea  . Emesis  . Otalgia    right    Recent Results (from the past 720 hour(s))  Urine culture     Status: Abnormal   Collection Time: 10/12/15 10:51 AM  Result Value Ref Range Status   Specimen Description URINE, RANDOM  Final   Special Requests NONE  Final   Culture >=100,000 COLONIES/mL KLEBSIELLA PNEUMONIAE (A)  Final   Report Status 10/14/2015 FINAL  Final   Organism ID, Bacteria KLEBSIELLA PNEUMONIAE (A)  Final      Susceptibility   Klebsiella pneumoniae - MIC*    AMPICILLIN 16 RESISTANT Resistant     CEFAZOLIN <=4 SENSITIVE Sensitive     CEFTRIAXONE <=1 SENSITIVE Sensitive     CIPROFLOXACIN <=0.25 SENSITIVE Sensitive     GENTAMICIN <=1 SENSITIVE Sensitive     IMIPENEM <=0.25 SENSITIVE Sensitive     NITROFURANTOIN 128 RESISTANT Resistant     TRIMETH/SULFA <=20 SENSITIVE Sensitive     AMPICILLIN/SULBACTAM <=2 SENSITIVE Sensitive     PIP/TAZO <=4 SENSITIVE Sensitive     * >=100,000 COLONIES/mL KLEBSIELLA PNEUMONIAE    [x]  Treated with nitrofurantoin, organism resistant to prescribed antimicrobial []  Patient discharged originally without antimicrobial agent and treatment is now indicated  Patient came in with complaints of right ear pain and nausea/vomiting. Recently started on metformin for newly diagnosed diabetes. She did not have any urinary symptoms. She was prescribed nitrofurantoin.  Will instruct her to stop taking this and not prescribe further antibiotics.  ED Provider: Jeannett SeniorElizabeth Westfall   Meredith Tilley 10/15/2015, 8:31 AM PharmD Candidate

## 2015-10-28 ENCOUNTER — Encounter: Payer: BLUE CROSS/BLUE SHIELD | Attending: Physician Assistant | Admitting: *Deleted

## 2015-10-28 VITALS — Ht 59.0 in | Wt 262.0 lb

## 2015-10-28 DIAGNOSIS — E1165 Type 2 diabetes mellitus with hyperglycemia: Secondary | ICD-10-CM

## 2015-10-28 DIAGNOSIS — E118 Type 2 diabetes mellitus with unspecified complications: Secondary | ICD-10-CM

## 2015-10-28 DIAGNOSIS — E119 Type 2 diabetes mellitus without complications: Secondary | ICD-10-CM | POA: Insufficient documentation

## 2015-10-28 DIAGNOSIS — IMO0002 Reserved for concepts with insufficient information to code with codable children: Secondary | ICD-10-CM

## 2015-10-28 NOTE — Progress Notes (Signed)
Patient was seen on 10/28/15 for the first of a series of three diabetes self-management courses at the [facility].  Patient Education Plan per assessed needs and concerns is to attend four course education program for Diabetes Self Management Education.  The following learning objectives were met by the patient during this class:  Describe diabetes  State some common risk factors for diabetes  Defines the role of glucose and insulin  Identifies type of diabetes and pathophysiology  Describe the relationship between diabetes and cardiovascular risk  State the members of the Healthcare Team  States the rationale for glucose monitoring  State when to test glucose  State their individual Target Range  State the importance of logging glucose readings  Describe how to interpret glucose readings  Identifies A1C target  Explain the correlation between A1c and eAG values  State symptoms and treatment of high blood glucose  State symptoms and treatment of low blood glucose  Explain proper technique for glucose testing  Identifies proper sharps disposal  Handouts given during class include:  Living Well with Diabetes book  Carb Counting and Meal Planning book  Meal Plan Card  Carbohydrate guide  Meal planning worksheet  Low Sodium Flavoring Tips  The diabetes portion plate  O5D to eAG Conversion Chart  Diabetes Medications  Diabetes Recommended Care Schedule  Support Group  Diabetes Success Plan  Core Class Satisfaction Survey  Follow-Up Plan:  Attend core 2

## 2015-11-04 DIAGNOSIS — E119 Type 2 diabetes mellitus without complications: Secondary | ICD-10-CM | POA: Diagnosis not present

## 2015-11-04 NOTE — Progress Notes (Signed)
Patient was seen on 11/04/15 for the second of a series of three diabetes self-management courses at the [facility]. The following learning objectives were met by the patient during this class:   Describe the role of different macronutrients on glucose  Explain how carbohydrates affect blood glucose  State what foods contain the most carbohydrates  Demonstrate carbohydrate counting  Demonstrate how to read Nutrition Facts food label  Describe effects of various fats on heart health  Describe the importance of good nutrition for health and healthy eating strategies  Patient left for her work before all topics were completed  Goals:  Follow Diabetes Meal Plan as instructed  Eat 3 meals and 2 snacks, every 3-5 hrs  Limit carbohydrate intake to 45 grams carbohydrate/meal Limit carbohydrate intake to 15 grams carbohydrate/snack Add lean protein foods to meals/snacks  Monitor glucose levels as instructed by your doctor   Follow-Up Plan:  Attend Core 3  Work towards following your personal food plan.

## 2015-11-11 DIAGNOSIS — E119 Type 2 diabetes mellitus without complications: Secondary | ICD-10-CM

## 2015-11-11 NOTE — Progress Notes (Signed)
Patient was seen on 11/11/15 for the third of a series of three diabetes self-management courses at the [facility].   Kelly Shelton. State the amount of activity recommended for healthy living . Describe activities suitable for individual needs . Identify ways to regularly incorporate activity into daily life . Identify barriers to activity and ways to over come these barriers  Identify diabetes medications being personally used and their primary action for lowering glucose and possible side effects . Describe role of stress on blood glucose and develop strategies to address psychosocial issues . Identify diabetes complications and ways to prevent them  Explain how to manage diabetes during illness . Evaluate success in meeting personal goal . Establish 2-3 goals that they will plan to diligently work on until they return for the  5580-month follow-up visit  Goals:   I will count my carb choices at most meals and snacks  I will be active 30 minutes or more 4 times a week  To help manage stress I will  exercise at least 4 times a week  Your patient has identified these potential barriers to change:  None stated  Your patient has identified their diabetes self-care support plan as  Family Support  Plan:  Attend Support Group as desired

## 2016-10-04 DIAGNOSIS — Z0000 Encounter for general adult medical examination without abnormal findings: Secondary | ICD-10-CM | POA: Diagnosis not present

## 2016-10-04 DIAGNOSIS — E119 Type 2 diabetes mellitus without complications: Secondary | ICD-10-CM | POA: Diagnosis not present

## 2016-10-04 DIAGNOSIS — Z13220 Encounter for screening for lipoid disorders: Secondary | ICD-10-CM | POA: Diagnosis not present

## 2017-01-17 DIAGNOSIS — E1165 Type 2 diabetes mellitus with hyperglycemia: Secondary | ICD-10-CM | POA: Diagnosis not present

## 2017-04-06 DIAGNOSIS — M6530 Trigger finger, unspecified finger: Secondary | ICD-10-CM | POA: Diagnosis not present

## 2017-04-06 DIAGNOSIS — E1165 Type 2 diabetes mellitus with hyperglycemia: Secondary | ICD-10-CM | POA: Diagnosis not present

## 2017-04-06 DIAGNOSIS — Z7984 Long term (current) use of oral hypoglycemic drugs: Secondary | ICD-10-CM | POA: Diagnosis not present

## 2018-01-16 IMAGING — US US ABDOMEN COMPLETE
1 series · 13 of 25 positions shown · non-contrast
Comparison: None.

CLINICAL DATA: Nausea, vomiting, weakness for 1 week. History of
diabetes.

EXAM:
ABDOMEN ULTRASOUND COMPLETE

[Series 1: us abdomen complete · 0.24mm/px · 13 of 92 slices shown]
[im 1/92]
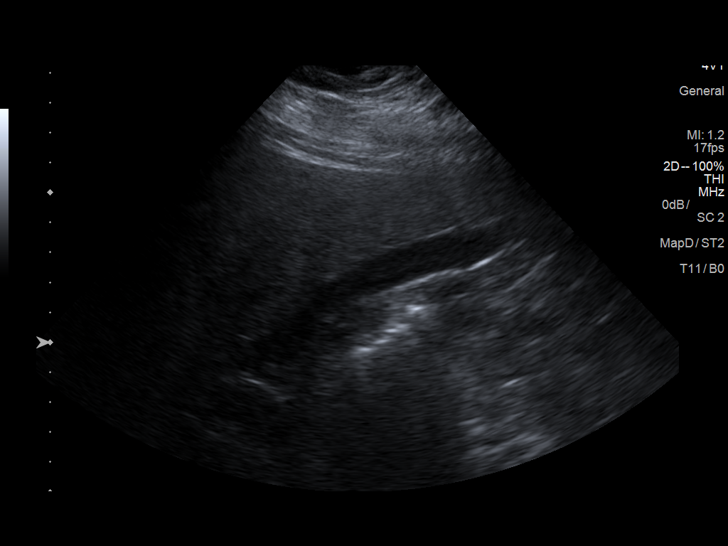
[im 8/92]
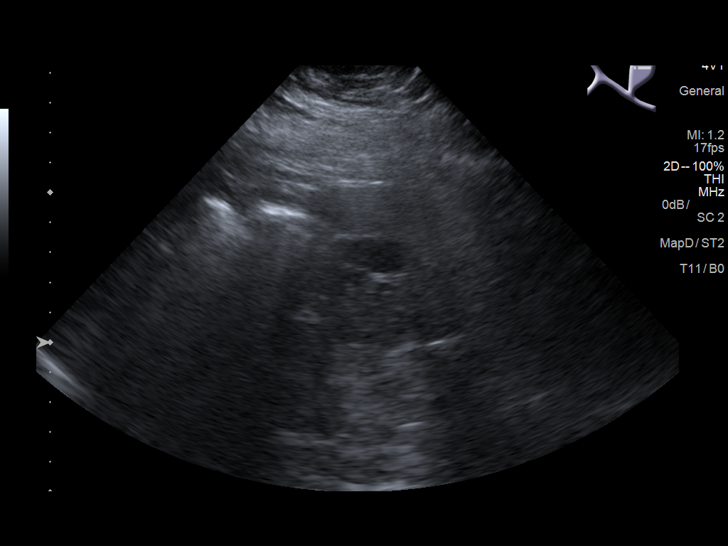
[im 16/92]
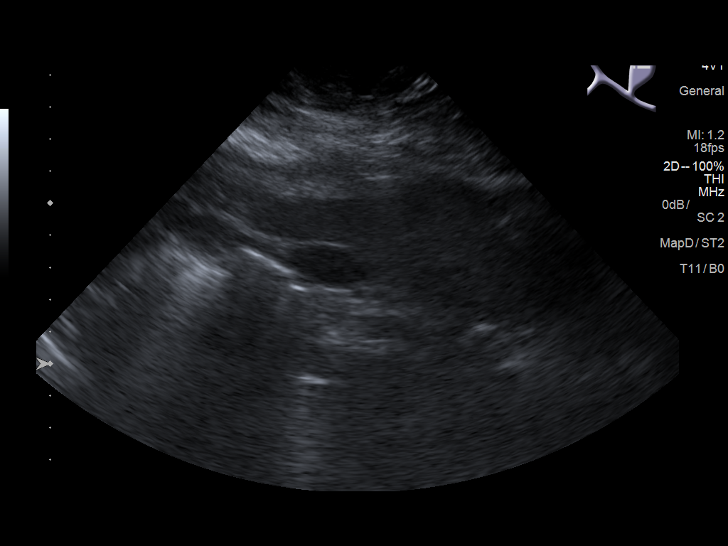
[im 23/92]
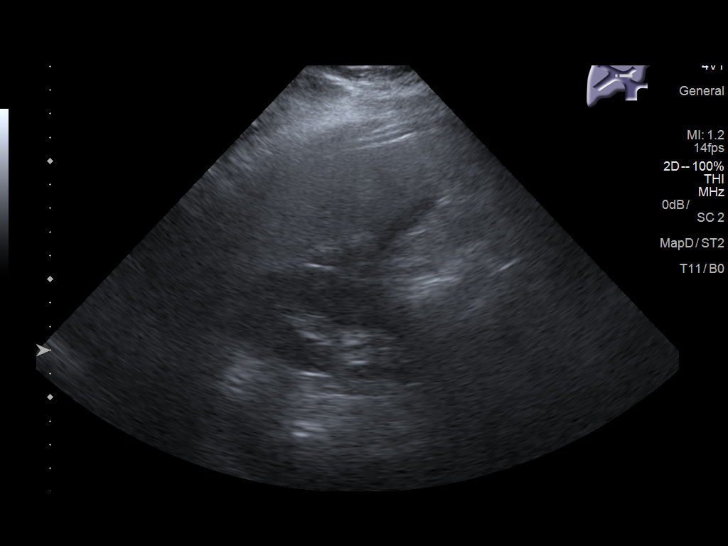
[im 31/92]
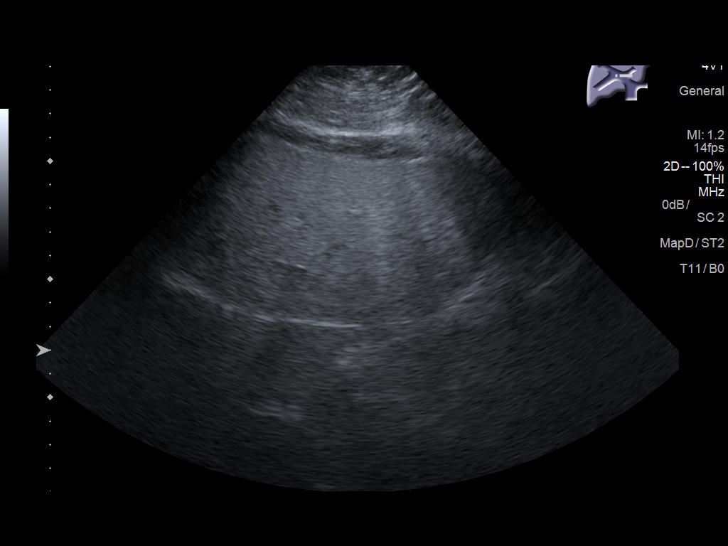
[im 38/92]
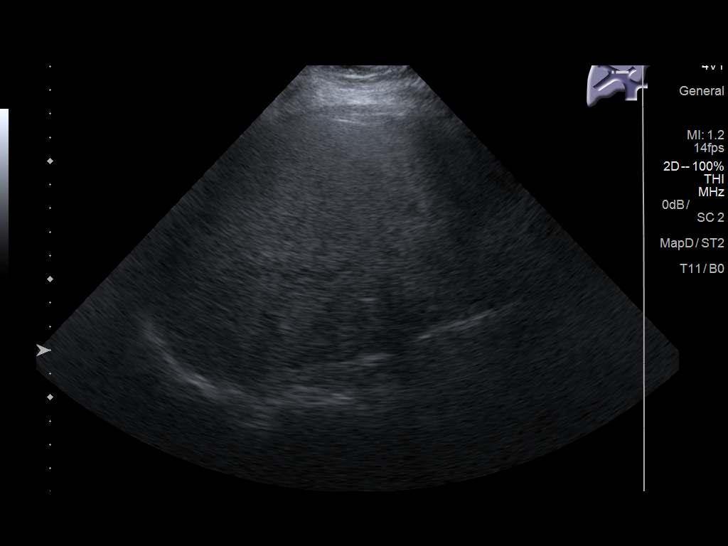
[im 46/92]
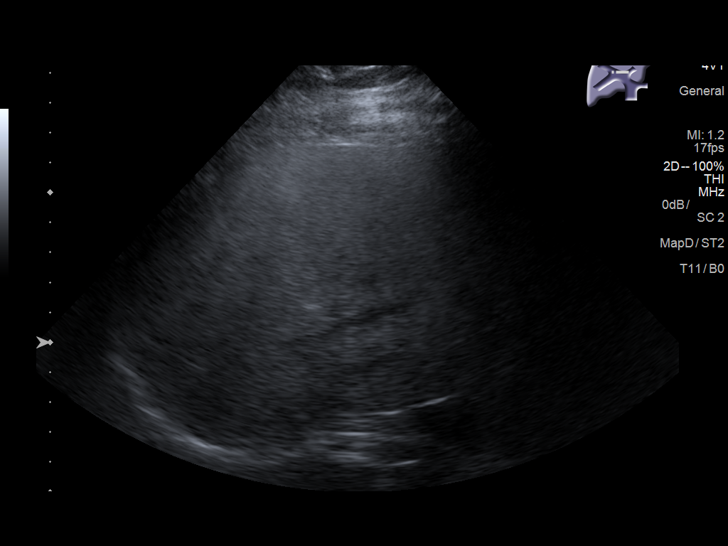
[im 54/92]
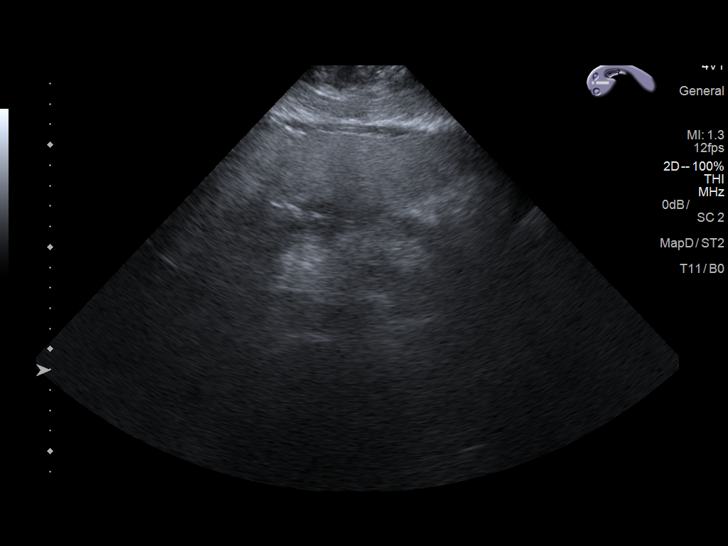
[im 61/92]
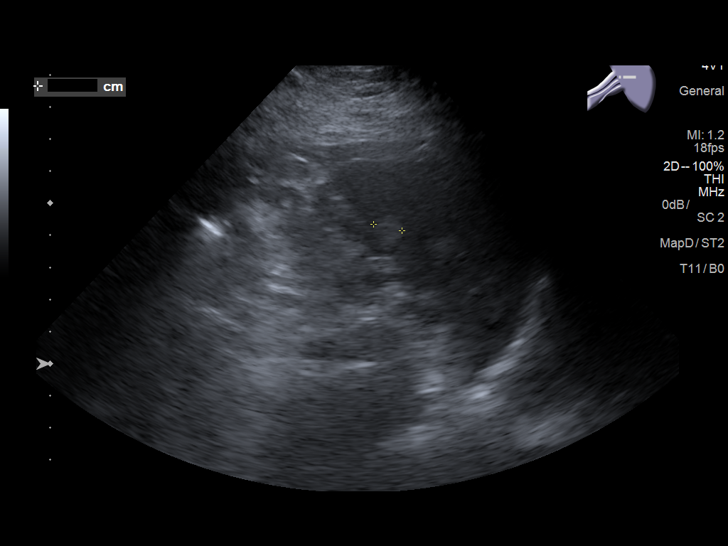
[im 69/92]
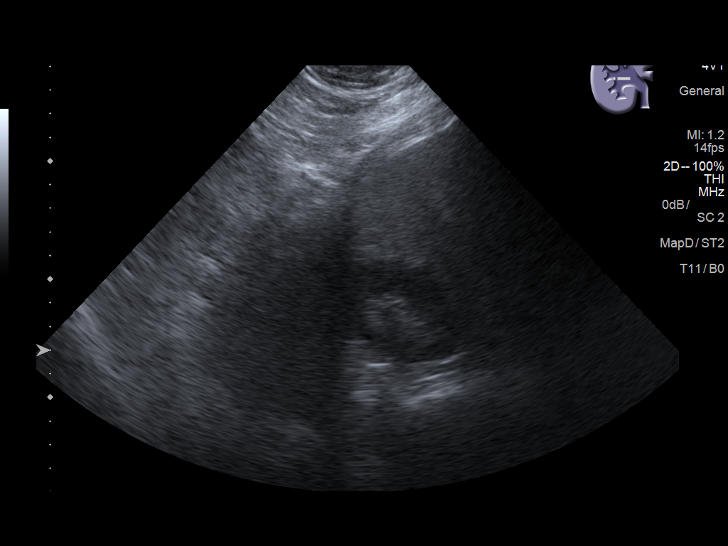
[im 76/92]
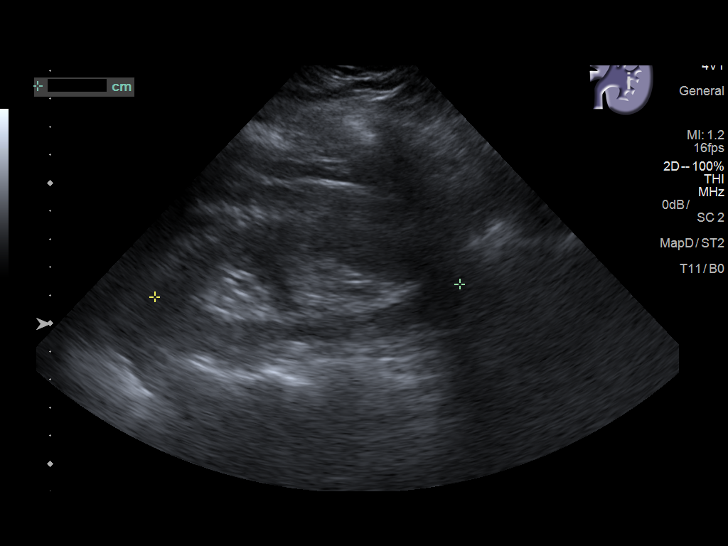
[im 84/92]
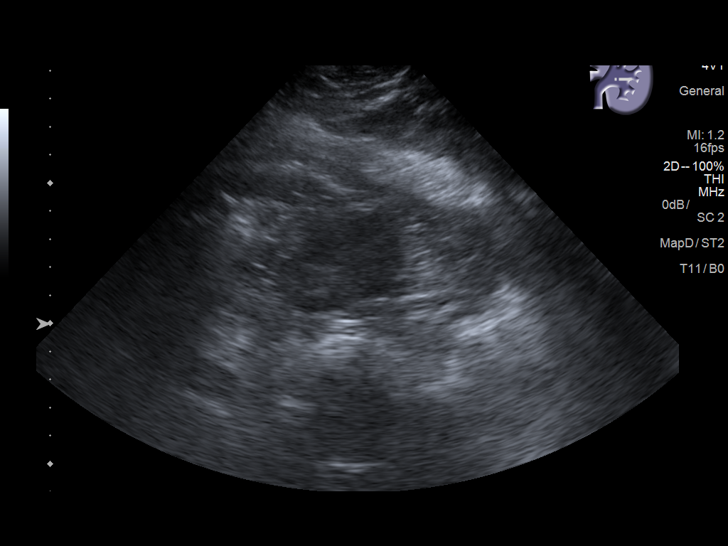
[im 92/92]
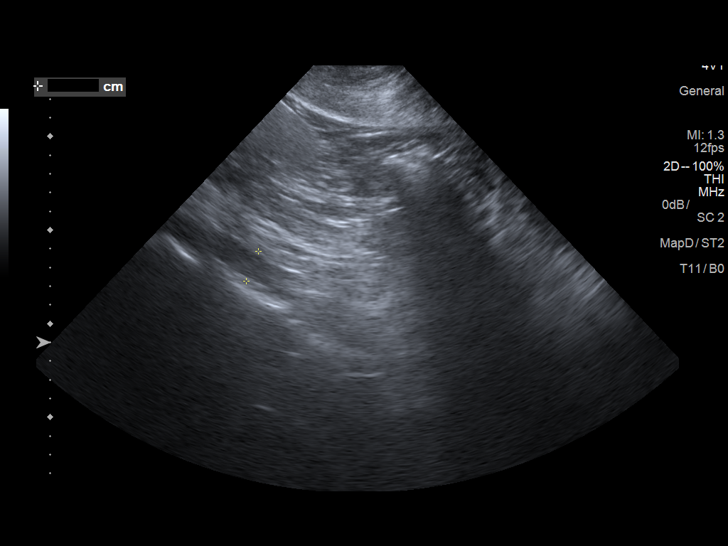

[13 of 25 positions shown; findings below may reference images not displayed]

FINDINGS: Gallbladder: Gallbladder has a normal appearance. Gallbladder wall
is 2.0 mm, within normal limits. No stones or pericholecystic fluid.
No sonographic Murphy's sign.

Common bile duct: Diameter: 2.6 mm

Liver: The liver is echogenic. There is attenuation of the
ultrasound wave, poor visualization of the internal hepatic
architecture, and loss of definition of the diaphragm. No focal
liver lesions are identified.

IVC: No abnormality visualized.

Pancreas: Visualized portion unremarkable.

Spleen: Spleen is 5.5 cm in length. There is a mildly heterogeneous
circumscribed hyperechoic lesion within the spleen measuring 1.0 x
1.1 x 0.9 cm.

Right Kidney: Length: 9.7 cm. Echogenicity within normal limits. No
mass or hydronephrosis visualized.

Left Kidney: Length: 10.9 cm. Echogenicity within normal limits. No
mass or hydronephrosis visualized.

Abdominal aorta: No aneurysm visualized.

Other findings: None.
IMPRESSION: 1. No evidence for acute cholecystitis.
2. Hyperechoic lesion within the spleen. Given the slightly
heterogeneous appearance, further characterization with MRI is
recommended. MRI should be performed when the patient is clinically
stable and able to follow breath holding instructions (usually best
performed on an outpatient basis).
3. Hepatic steatosis.

## 2018-06-30 DIAGNOSIS — Z1231 Encounter for screening mammogram for malignant neoplasm of breast: Secondary | ICD-10-CM | POA: Diagnosis not present

## 2018-08-17 DIAGNOSIS — E1169 Type 2 diabetes mellitus with other specified complication: Secondary | ICD-10-CM | POA: Diagnosis not present

## 2018-11-10 DIAGNOSIS — E1169 Type 2 diabetes mellitus with other specified complication: Secondary | ICD-10-CM | POA: Diagnosis not present

## 2018-11-10 DIAGNOSIS — R030 Elevated blood-pressure reading, without diagnosis of hypertension: Secondary | ICD-10-CM | POA: Diagnosis not present

## 2018-11-10 DIAGNOSIS — E7800 Pure hypercholesterolemia, unspecified: Secondary | ICD-10-CM | POA: Diagnosis not present

## 2018-12-30 ENCOUNTER — Other Ambulatory Visit: Payer: Self-pay

## 2018-12-30 DIAGNOSIS — Z20822 Contact with and (suspected) exposure to covid-19: Secondary | ICD-10-CM

## 2019-01-05 LAB — NOVEL CORONAVIRUS, NAA: SARS-CoV-2, NAA: NOT DETECTED

## 2019-01-09 ENCOUNTER — Telehealth: Payer: Self-pay | Admitting: General Practice

## 2019-01-09 NOTE — Telephone Encounter (Signed)
Negative results was given to pt °

## 2019-04-28 ENCOUNTER — Other Ambulatory Visit: Payer: Self-pay

## 2019-04-28 DIAGNOSIS — Z20822 Contact with and (suspected) exposure to covid-19: Secondary | ICD-10-CM

## 2019-04-29 LAB — NOVEL CORONAVIRUS, NAA: SARS-CoV-2, NAA: NOT DETECTED

## 2019-06-29 ENCOUNTER — Ambulatory Visit: Payer: BC Managed Care – PPO | Attending: Internal Medicine

## 2019-06-29 DIAGNOSIS — Z20822 Contact with and (suspected) exposure to covid-19: Secondary | ICD-10-CM | POA: Insufficient documentation

## 2019-07-01 LAB — NOVEL CORONAVIRUS, NAA: SARS-CoV-2, NAA: NOT DETECTED

## 2019-09-13 ENCOUNTER — Ambulatory Visit: Payer: Self-pay | Attending: Internal Medicine

## 2019-09-13 DIAGNOSIS — Z23 Encounter for immunization: Secondary | ICD-10-CM

## 2019-09-13 NOTE — Progress Notes (Signed)
   Covid-19 Vaccination Clinic  Name:  Kelly Shelton    MRN: [id number] DOB: 1965-07-10  09/13/2019  Kelly Shelton was observed post Covid-19 immunization for 15 minutes without incident. She was provided with Vaccine Information Sheet and instruction to access the V-Safe system.   Kelly Shelton was instructed to call 911 with any severe reactions post vaccine: Marland Kitchen Difficulty breathing  . Swelling of face and throat  . A fast heartbeat  . A bad rash all over body  . Dizziness and weakness   Immunizations Administered    Name Date Dose VIS Date Route   Pfizer COVID-19 Vaccine 09/13/2019  5:15 PM 0.3 mL 06/01/2019 Intramuscular   Manufacturer: [employer], [employer]   Lot: [id number]   NDC: [address]-3

## 2019-10-08 ENCOUNTER — Ambulatory Visit: Payer: BC Managed Care – PPO | Attending: Internal Medicine

## 2019-10-08 DIAGNOSIS — Z23 Encounter for immunization: Secondary | ICD-10-CM

## 2019-10-08 NOTE — Progress Notes (Signed)
   Covid-19 Vaccination Clinic  Name:  Kelly Shelton    MRN: [id number] DOB: 1965/10/02  10/08/2019  [name] was observed post Covid-19 immunization for 15 minutes without incident. She was provided with Vaccine Information Sheet and instruction to access the V-Safe system.   [name] was instructed to call 911 with any severe reactions post vaccine: Marland Kitchen Difficulty breathing  . Swelling of face and throat  . A fast heartbeat  . A bad rash all over body  . Dizziness and weakness   Immunizations Administered    Name Date Dose VIS Date Route   Pfizer COVID-19 Vaccine 10/08/2019  1:40 PM 0.3 mL 08/15/2018 Intramuscular   Manufacturer: [employer], [employer]   Lot: [id number]   NDC: [address]-3

## 2019-10-30 ENCOUNTER — Encounter: Payer: Self-pay | Admitting: Obstetrics and Gynecology

## 2020-07-03 DIAGNOSIS — Z1231 Encounter for screening mammogram for malignant neoplasm of breast: Secondary | ICD-10-CM | POA: Diagnosis not present

## 2021-04-08 ENCOUNTER — Encounter: Payer: Self-pay | Admitting: Gastroenterology

## 2021-05-12 ENCOUNTER — Other Ambulatory Visit: Payer: Self-pay

## 2021-05-12 ENCOUNTER — Ambulatory Visit (AMBULATORY_SURGERY_CENTER): Payer: Self-pay

## 2021-05-12 VITALS — Ht 61.0 in | Wt 260.0 lb

## 2021-05-12 DIAGNOSIS — Z1211 Encounter for screening for malignant neoplasm of colon: Secondary | ICD-10-CM

## 2021-05-12 MED ORDER — PEG 3350-KCL-NA BICARB-NACL 420 G PO SOLR: 4000.0000 mL | Freq: Once | ORAL | 0 refills | Status: AC

## 2021-05-12 NOTE — Progress Notes (Signed)
Denies allergies to eggs or soy products. Denies complication of anesthesia or sedation. Denies use of weight loss medication. Denies use of O2.   Emmi instructions given for colonoscopy.  

## 2021-05-22 ENCOUNTER — Encounter: Payer: Self-pay | Admitting: Gastroenterology

## 2021-05-27 ENCOUNTER — Encounter: Payer: BC Managed Care – PPO | Admitting: Gastroenterology

## 2021-05-29 ENCOUNTER — Ambulatory Visit (AMBULATORY_SURGERY_CENTER): Payer: BC Managed Care – PPO | Admitting: Gastroenterology

## 2021-05-29 ENCOUNTER — Other Ambulatory Visit: Payer: Self-pay

## 2021-05-29 ENCOUNTER — Encounter: Payer: Self-pay | Admitting: Gastroenterology

## 2021-05-29 VITALS — BP 149/100 | HR 80 | Temp 97.1°F | Resp 11 | Ht 61.0 in | Wt 254.0 lb

## 2021-05-29 DIAGNOSIS — Z1211 Encounter for screening for malignant neoplasm of colon: Secondary | ICD-10-CM

## 2021-05-29 MED ORDER — SODIUM CHLORIDE 0.9 % IV SOLN: 500.0000 mL | Freq: Once | INTRAVENOUS | Status: DC

## 2021-05-29 NOTE — Progress Notes (Signed)
VS  DT ? ?Pt's states no medical or surgical changes since previsit or office visit. ? ?

## 2021-05-29 NOTE — Progress Notes (Signed)
PT taken to PACU. Monitors in place. VSS. Report given to RN. 

## 2021-05-29 NOTE — Op Note (Signed)
[facility] Patient Name: Kelly Shelton Procedure Date: 05/29/2021 1:15 PM MRN: [id number] Endoscopist: Tressia Danas MD, MD Age: 55 Referring MD:  Date of Birth: 10/31/1965 Gender: Female Account #: [id number] Procedure:                Colonoscopy Indications:              Screening for colorectal malignant neoplasm, This                            is the patient's first colonoscopy Medicines:                Monitored Anesthesia Care Procedure:                Pre-Anesthesia Assessment:                           - Prior to the procedure, a History and Physical                            was performed, and patient medications and                            allergies were reviewed. The patient's tolerance of                            previous anesthesia was also reviewed. The risks                            and benefits of the procedure and the sedation                            options and risks were discussed with the patient.                            All questions were answered, and informed consent                            was obtained. Prior Anticoagulants: The patient has                            taken no previous anticoagulant or antiplatelet                            agents. ASA Grade Assessment: III - A patient with                            severe systemic disease. After reviewing the risks                            and benefits, the patient was deemed in                            satisfactory condition to undergo the procedure.  After obtaining informed consent, the colonoscope                            was passed under direct vision. Throughout the                            procedure, the patient's blood pressure, pulse, and                            oxygen saturations were monitored continuously. The                            CF [id number] #[id number] was introduced through the anus                            and advanced to  the 3 cm into the ileum. A second                            forward view of the right colon was performed. The                            colonoscopy was performed without difficulty. The                            patient tolerated the procedure well. The quality                            of the bowel preparation was good. The terminal                            ileum, ileocecal valve, appendiceal orifice, and                            rectum were photographed. Scope In: 1:40:38 PM Scope Out: 1:52:06 PM Scope Withdrawal Time: 0 hours 9 minutes 9 seconds  Total Procedure Duration: 0 hours 11 minutes 28 seconds  Findings:                 The perianal and digital rectal examinations were                            normal.                           The colon (entire examined portion) appeared normal.                           The terminal ileum appeared normal.                           The exam was otherwise without abnormality on                            direct and retroflexion views. Complications:  No immediate complications. Estimated Blood Loss:     Estimated blood loss: none. Impression:               - The entire examined colon is normal.                           - The examined portion of the ileum was normal.                           - The examination was otherwise normal on direct                            and retroflexion views.                           - No specimens collected. Recommendation:           - Patient has a contact number available for                            emergencies. The signs and symptoms of potential                            delayed complications were discussed with the                            patient. Return to normal activities tomorrow.                            Written discharge instructions were provided to the                            patient.                           - Resume previous diet.                           -  Continue present medications.                           - Repeat colonoscopy in 10 years for surveillance,                            earlier with new symmptoms.                           - Emerging evidence supports eating a diet of                            fruits, vegetables, grains, calcium, and yogurt                            while reducing red meat and alcohol may reduce the  risk of colon cancer.                           - Thank you for allowing me to be involved in your                            colon cancer prevention. Tressia Danas MD, MD 05/29/2021 1:56:30 PM This report has been signed electronically.

## 2021-05-29 NOTE — Patient Instructions (Signed)
Resume previous diet and medications. ° °Repeat colonoscopy in 10 years. ° ° °YOU HAD AN ENDOSCOPIC PROCEDURE TODAY AT THE [facility]:   Refer to the procedure report that was given to you for any specific questions about what was found during the examination.  If the procedure report does not answer your questions, please call your gastroenterologist to clarify.  If you requested that your care partner not be given the details of your procedure findings, then the procedure report has been included in a sealed envelope for you to review at your convenience later. ° °YOU SHOULD EXPECT: Some feelings of bloating in the abdomen. Passage of more gas than usual.  Walking can help get rid of the air that was put into your GI tract during the procedure and reduce the bloating. If you had a lower endoscopy (such as a colonoscopy or flexible sigmoidoscopy) you may notice spotting of blood in your stool or on the toilet paper. If you underwent a bowel prep for your procedure, you may not have a normal bowel movement for a few days. ° °Please Note:  You might notice some irritation and congestion in your nose or some drainage.  This is from the oxygen used during your procedure.  There is no need for concern and it should clear up in a day or so. ° °SYMPTOMS TO REPORT IMMEDIATELY: ° °Following lower endoscopy (colonoscopy or flexible sigmoidoscopy): ° Excessive amounts of blood in the stool ° Significant tenderness or worsening of abdominal pains ° Swelling of the abdomen that is new, acute ° Fever of 100°F or higher ° ° °For urgent or emergent issues, a gastroenterologist can be reached at any hour by calling [phone]. °Do not use MyChart messaging for urgent concerns.  ° ° °DIET:  We do recommend a small meal at first, but then you may proceed to your regular diet.  Drink plenty of fluids but you should avoid alcoholic beverages for 24 hours. ° °ACTIVITY:  You should plan to take it easy for the rest of  today and you should NOT DRIVE or use heavy machinery until tomorrow (because of the sedation medicines used during the test).   ° °FOLLOW UP: °Our staff will call the number listed on your records 48-72 hours following your procedure to check on you and address any questions or concerns that you may have regarding the information given to you following your procedure. If we do not reach you, we will leave a message.  We will attempt to reach you two times.  During this call, we will ask if you have developed any symptoms of COVID 19. If you develop any symptoms (ie: fever, flu-like symptoms, shortness of breath, cough etc.) before then, please call [phone].  If you test positive for Covid 19 in the 2 weeks post procedure, please call and report this information to us.   ° °If any biopsies were taken you will be contacted by phone or by letter within the next 1-3 weeks.  Please call us at [phone] if you have not heard about the biopsies in 3 weeks.  ° ° °SIGNATURES/CONFIDENTIALITY: °You and/or your care partner have signed paperwork which will be entered into your electronic medical record.  These signatures attest to the fact that that the information above on your After Visit Summary has been reviewed and is understood.  Full responsibility of the confidentiality of this discharge information lies with you and/or your care-partner.  °

## 2021-05-29 NOTE — Progress Notes (Signed)
   Referring Provider: Tressia Danas, MD Primary Care Physician:  Pcp, No  Reason for Procedure:  Colon cancer screening   IMPRESSION:  Need for colon cancer screening Appropriate candidate for monitored anesthesia care  PLAN: Colonoscopy in the [facility] today   HPI: Kelly Shelton is a 55 y.o. female presents for screening colonoscopy.  No prior colonoscopy or colon cancer screening.  No baseline GI symptoms.   Adopted. No known family history of colon cancer or polyps. No family history of uterine/endometrial cancer, pancreatic cancer or gastric/stomach cancer.   Past Medical History:  Diagnosis Date   Allergy    Diabetes mellitus without complication (HCC)    Sickle cell trait (HCC)    Vertigo     Past Surgical History:  Procedure Laterality Date   CESAREAN SECTION      Current Outpatient Medications  Medication Sig Dispense Refill   metFORMIN (GLUCOPHAGE) 500 MG tablet Take 1,000 mg by mouth at bedtime.  5   Current Facility-Administered Medications  Medication Dose Route Frequency Provider Last Rate Last Admin   0.9 %  sodium chloride infusion  500 mL Intravenous Once Tressia Danas, MD        Allergies as of 05/29/2021   (No Known Allergies)    Family History  Adopted: Yes     Physical Exam: General:   Alert,  well-nourished, pleasant and cooperative in NAD Head:  Normocephalic and atraumatic. Eyes:  Sclera clear, no icterus.   Conjunctiva pink. Mouth:  No deformity or lesions.   Neck:  Supple; no masses or thyromegaly. Lungs:  Clear throughout to auscultation.   No wheezes. Heart:  Regular rate and rhythm; no murmurs. Abdomen:  Soft, non-tender, nondistended, normal bowel sounds, no rebound or guarding.  Msk:  Symmetrical. No boney deformities LAD: No inguinal or umbilical LAD Extremities:  No clubbing or edema. Neurologic:  Alert and  oriented x4;  grossly nonfocal Skin:  No obvious rash or bruise. Psych:  Alert and cooperative. Normal  mood and affect.      Lorena Clearman L. Orvan Falconer, MD, MPH 05/29/2021, 1:35 PM

## 2021-06-02 ENCOUNTER — Telehealth: Payer: Self-pay

## 2021-06-02 ENCOUNTER — Telehealth: Payer: Self-pay | Admitting: *Deleted

## 2021-06-02 NOTE — Telephone Encounter (Signed)
Left message on follow up call. 

## 2021-06-02 NOTE — Telephone Encounter (Signed)
°  Follow up Call-  Call back number 05/29/2021  Post procedure Call Back phone  # [phone]  Permission to leave phone message Yes  Some recent data might be hidden     Patient questions:  Do you have a fever, pain , or abdominal swelling? No. Pain Score  0 *  Have you tolerated food without any problems? Yes.    Have you been able to return to your normal activities? Yes.    Do you have any questions about your discharge instructions: Diet   No. Medications  No. Follow up visit  No.  Do you have questions or concerns about your Care? No.  Actions: * If pain score is 4 or above: No action needed, pain <4.
